# Patient Record
Sex: Female | Born: 1970 | Race: White | Hispanic: No | Marital: Married | Smoking: Former smoker
Health system: Southern US, Community
[De-identification: ages and names within clinical notes are randomized; demographics above are authoritative.]

## PROBLEM LIST (undated history)

## (undated) DIAGNOSIS — Z803 Family history of malignant neoplasm of breast: Secondary | ICD-10-CM

## (undated) DIAGNOSIS — I10 Essential (primary) hypertension: Secondary | ICD-10-CM

## (undated) DIAGNOSIS — T7840XA Allergy, unspecified, initial encounter: Secondary | ICD-10-CM

## (undated) HISTORY — DX: Essential (primary) hypertension: I10

## (undated) HISTORY — DX: Allergy, unspecified, initial encounter: T78.40XA

## (undated) HISTORY — PX: INCISE AND DRAIN ABCESS: PRO64

## (undated) HISTORY — DX: Family history of malignant neoplasm of breast: Z80.3

---

## 2001-01-27 ENCOUNTER — Other Ambulatory Visit: Admission: RE | Admit: 2001-01-27 | Discharge: 2001-01-27 | Payer: Self-pay | Admitting: Gynecology

## 2002-04-19 ENCOUNTER — Other Ambulatory Visit: Admission: RE | Admit: 2002-04-19 | Discharge: 2002-04-19 | Payer: Self-pay | Admitting: Gynecology

## 2002-06-22 HISTORY — PX: GALLBLADDER SURGERY: SHX652

## 2002-08-08 ENCOUNTER — Encounter: Payer: Self-pay | Admitting: Family Medicine

## 2002-08-08 ENCOUNTER — Ambulatory Visit (HOSPITAL_COMMUNITY): Admission: RE | Admit: 2002-08-08 | Discharge: 2002-08-08 | Payer: Self-pay | Admitting: Family Medicine

## 2002-09-05 ENCOUNTER — Encounter (HOSPITAL_COMMUNITY): Admission: RE | Admit: 2002-09-05 | Discharge: 2002-10-05 | Payer: Self-pay | Admitting: Family Medicine

## 2002-09-05 ENCOUNTER — Encounter: Payer: Self-pay | Admitting: Family Medicine

## 2002-09-06 ENCOUNTER — Encounter: Payer: Self-pay | Admitting: Family Medicine

## 2002-09-25 ENCOUNTER — Ambulatory Visit (HOSPITAL_COMMUNITY): Admission: RE | Admit: 2002-09-25 | Discharge: 2002-09-25 | Payer: Self-pay | Admitting: General Surgery

## 2003-05-01 ENCOUNTER — Ambulatory Visit (HOSPITAL_COMMUNITY): Admission: RE | Admit: 2003-05-01 | Discharge: 2003-05-01 | Payer: Self-pay | Admitting: Gynecology

## 2003-05-01 ENCOUNTER — Other Ambulatory Visit: Admission: RE | Admit: 2003-05-01 | Discharge: 2003-05-01 | Payer: Self-pay | Admitting: Gynecology

## 2004-05-29 ENCOUNTER — Ambulatory Visit (HOSPITAL_COMMUNITY): Admission: RE | Admit: 2004-05-29 | Discharge: 2004-05-29 | Payer: Self-pay | Admitting: Family Medicine

## 2004-09-01 ENCOUNTER — Ambulatory Visit (HOSPITAL_COMMUNITY): Admission: RE | Admit: 2004-09-01 | Discharge: 2004-09-01 | Payer: Self-pay | Admitting: Family Medicine

## 2004-10-08 ENCOUNTER — Other Ambulatory Visit: Admission: RE | Admit: 2004-10-08 | Discharge: 2004-10-08 | Payer: Self-pay | Admitting: Gynecology

## 2008-06-04 ENCOUNTER — Emergency Department (HOSPITAL_COMMUNITY): Admission: EM | Admit: 2008-06-04 | Discharge: 2008-06-04 | Payer: Self-pay | Admitting: Emergency Medicine

## 2008-06-27 ENCOUNTER — Emergency Department (HOSPITAL_COMMUNITY): Admission: EM | Admit: 2008-06-27 | Discharge: 2008-06-27 | Payer: Self-pay | Admitting: Emergency Medicine

## 2009-11-27 ENCOUNTER — Ambulatory Visit (HOSPITAL_COMMUNITY): Admission: RE | Admit: 2009-11-27 | Discharge: 2009-11-27 | Payer: Self-pay | Admitting: General Surgery

## 2009-12-13 ENCOUNTER — Ambulatory Visit (HOSPITAL_COMMUNITY): Admission: RE | Admit: 2009-12-13 | Discharge: 2009-12-13 | Payer: Self-pay | Admitting: General Surgery

## 2010-01-08 ENCOUNTER — Encounter: Admission: RE | Admit: 2010-01-08 | Discharge: 2010-01-30 | Payer: Self-pay | Admitting: General Surgery

## 2010-05-22 ENCOUNTER — Encounter
Admission: RE | Admit: 2010-05-22 | Discharge: 2010-07-22 | Payer: Self-pay | Source: Home / Self Care | Attending: General Surgery | Admitting: General Surgery

## 2010-06-10 ENCOUNTER — Emergency Department (HOSPITAL_COMMUNITY)
Admission: EM | Admit: 2010-06-10 | Discharge: 2010-06-10 | Payer: Self-pay | Source: Home / Self Care | Admitting: Emergency Medicine

## 2010-06-10 ENCOUNTER — Ambulatory Visit (HOSPITAL_COMMUNITY)
Admission: RE | Admit: 2010-06-10 | Discharge: 2010-06-10 | Payer: Self-pay | Source: Home / Self Care | Attending: General Surgery | Admitting: General Surgery

## 2010-06-10 HISTORY — PX: LAPAROSCOPIC GASTRIC BANDING: SHX1100

## 2010-06-11 ENCOUNTER — Ambulatory Visit: Admit: 2010-06-11 | Payer: Self-pay | Admitting: Emergency Medicine

## 2010-06-13 ENCOUNTER — Ambulatory Visit
Admission: RE | Admit: 2010-06-13 | Discharge: 2010-06-13 | Payer: Self-pay | Source: Home / Self Care | Attending: General Surgery | Admitting: General Surgery

## 2010-06-13 ENCOUNTER — Encounter (INDEPENDENT_AMBULATORY_CARE_PROVIDER_SITE_OTHER): Payer: Self-pay | Admitting: General Surgery

## 2010-06-19 ENCOUNTER — Inpatient Hospital Stay (HOSPITAL_COMMUNITY)
Admission: EM | Admit: 2010-06-19 | Discharge: 2010-06-23 | Payer: Self-pay | Source: Home / Self Care | Attending: General Surgery | Admitting: General Surgery

## 2010-07-01 ENCOUNTER — Encounter: Admit: 2010-07-01 | Discharge: 2010-07-22 | Payer: Self-pay | Attending: General Surgery | Admitting: General Surgery

## 2010-07-17 NOTE — Discharge Summary (Addendum)
April Calderon, April NO.:  [id number]  MEDICAL RECORD NO.:  [id number]          PATIENT TYPE:  INP  LOCATION:  1530                         FACILITY:  [facility]  PHYSICIAN:  Mary Sella. Andrey Campanile, MD     DATE OF BIRTH:  10/24/1970  DATE OF ADMISSION:  06/19/2010 DATE OF DISCHARGE:  06/23/2010                              DISCHARGE SUMMARY   ADMITTING DIAGNOSES: 1. Abdominal pain. 2. Leukocytosis. 3. Hypertension. 4. Morbid obesity.  DISCHARGE DIAGNOSES: 1. Hypertension. 2. Morbid obesity. 3. Resolving leukocytosis. 4. Improved abdominal pain.  BRIEF HOSPITAL COURSE:  The patient was admitted on June 19, 2010, after she presented to the ER complaining of left shoulder and epigastric and back pain.  She had recently undergone a laparoscopic adjustable gastric band placement by me on June 18, 2010.  She was doing well until the evening of June 19, 2010.  She developed acute onset of left shoulder, epigastric and back pain which was getting worse.  She denied any nausea, vomiting or retching.  She has been having bowel movements.  She denied any fevers.  In the ER, she was mildly tachycardic and was found to have elevated white blood cell count.  She was admitted and put on antibiotics and a CT scan was ordered to evaluate the leukocytosis.  A plain film of her abdomen demonstrated that her band was in good position.  The CT of her abdomen and pelvis confirmed that her band was in good position.  There was some edema in the fat adjacent to the proximal stomach as well as some extraluminal gas surrounding the band and beneath the left hemidiaphragm.  Other than that, there was no sign of gross free air. There was no evidence of contrast extravasation.  Because of the edema and fat adjacent to the proximal stomach as well as a few blips of extraluminal gas, I was concerned for possible leak.  So, therefore she underwent Gastrografin swallow the following  morning.  Again, the band was confirmed to be in good position.  Contrast passed immediately into the distal esophagus and promptly to the gastric band in the stomach.  A small volume of contrast outlined the redundant area of mucosa along the left superior portion of the band.  However, the contrast proceeded through the stomach and promptly entered the duodenum.  There was no evidence of contrast extravasation.  She was kept n.p.o. and bowel rest for several days as well as on IV antibiotics.  Her leukocytosis quickly decreased throughout her hospitalization.  She was kept on bowel rest until Sunday.  She was started on clears on June 23, 2010.  She had tolerated clear liquid diet without any fevers or chills.  She had no nausea or vomiting.  Her vital signs were stable and her leukocytosis had normalized.  Her white blood cell count was 7 and she went home later on the day on June 23, 2010.  It was  possible that one of her gastrogastric imbrication sutures had pulled out causing some of this edema and few blips of extraluminal gas but it was still unclear.  Fortunately, there were no signs of overt leak.  DISCHARGE/PLAN:  She was discharged home.  She was going to follow up with me within 1 week.  She is instructed to take adjustable gastric banding diet.  She was instructed to call for any fever, worsening abdominal pain, any nausea or vomiting or any questions or concerns.  DISCHARGE MEDICATIONS:  She is instructed to continue her home medications of Hyzaar, Lortab as needed for discomfort and Zyrtec as needed.  She was also given a prescription of Augmentin to complete a 1- week course.     Mary Sella. Andrey Campanile, MD     EMW/MEDQ  D:  07/10/2010  T:  07/10/2010  Job:  [id number]  cc:   Dr. Phillips Odor  Electronically Signed by Gaynelle Adu M.D. on 07/17/2010 08:11:23 AM

## 2010-08-12 ENCOUNTER — Encounter: Payer: Managed Care, Other (non HMO) | Attending: General Surgery | Admitting: *Deleted

## 2010-08-12 DIAGNOSIS — Z09 Encounter for follow-up examination after completed treatment for conditions other than malignant neoplasm: Secondary | ICD-10-CM | POA: Insufficient documentation

## 2010-08-12 DIAGNOSIS — Z713 Dietary counseling and surveillance: Secondary | ICD-10-CM | POA: Insufficient documentation

## 2010-08-12 DIAGNOSIS — Z9884 Bariatric surgery status: Secondary | ICD-10-CM | POA: Insufficient documentation

## 2010-09-01 LAB — BASIC METABOLIC PANEL
BUN: 9 mg/dL (ref 6–23)
CO2: 22 mEq/L (ref 19–32)
Chloride: 113 mEq/L — ABNORMAL HIGH (ref 96–112)
Chloride: 114 mEq/L — ABNORMAL HIGH (ref 96–112)
GFR calc Af Amer: >60 mL/min (ref >60)
GFR calc non Af Amer: >60 mL/min (ref >60)
GFR calc non Af Amer: >60 mL/min (ref >60)
Glucose, Bld: 78 mg/dL (ref 70–99)
Glucose, Bld: 87 mg/dL (ref 70–99)
Potassium: 3.8 mEq/L (ref 3.5–5.1)
Potassium: 4.1 mEq/L (ref 3.5–5.1)
Potassium: 4.2 mEq/L (ref 3.5–5.1)
Sodium: 143 mEq/L (ref 135–145)
Sodium: 145 mEq/L (ref 135–145)

## 2010-09-01 LAB — CBC
HCT: 34.7 % — ABNORMAL LOW (ref 36.0–46.0)
HCT: 36.3 % (ref 36.0–46.0)
HCT: 40.9 % (ref 36.0–46.0)
HCT: 42.3 % (ref 36.0–46.0)
Hemoglobin: 11 g/dL — ABNORMAL LOW (ref 12.0–15.0)
Hemoglobin: 11.7 g/dL — ABNORMAL LOW (ref 12.0–15.0)
Hemoglobin: 13.7 g/dL (ref 12.0–15.0)
Hemoglobin: 14.8 g/dL (ref 12.0–15.0)
MCH: 27.3 pg (ref 26.0–34.0)
MCH: 27.4 pg (ref 26.0–34.0)
MCH: 27.7 pg (ref 26.0–34.0)
MCH: 28.5 pg (ref 26.0–34.0)
MCHC: 31.7 g/dL (ref 30.0–36.0)
MCHC: 31.8 g/dL (ref 30.0–36.0)
MCHC: 32.4 g/dL (ref 30.0–36.0)
MCHC: 33.4 g/dL (ref 30.0–36.0)
MCV: 85.4 fL (ref 78.0–100.0)
MCV: 85.6 fL (ref 78.0–100.0)
MCV: 85.9 fL (ref 78.0–100.0)
MCV: 86.4 fL (ref 78.0–100.0)
MCV: 86.5 fL (ref 78.0–100.0)
Platelets: 195 10*3/uL (ref 150–400)
Platelets: 254 10*3/uL (ref 150–400)
Platelets: 297 10*3/uL (ref 150–400)
RBC: 4.75 MIL/uL (ref 3.87–5.11)
RBC: 4.94 MIL/uL (ref 3.87–5.11)
RBC: 5.19 MIL/uL — ABNORMAL HIGH (ref 3.87–5.11)
RDW: 14.6 % (ref 11.5–15.5)
RDW: 15.3 % (ref 11.5–15.5)
RDW: 15.5 % (ref 11.5–15.5)
WBC: 12.7 10*3/uL — ABNORMAL HIGH (ref 4.0–10.5)
WBC: 13.1 10*3/uL — ABNORMAL HIGH (ref 4.0–10.5)
WBC: 16.4 10*3/uL — ABNORMAL HIGH (ref 4.0–10.5)

## 2010-09-01 LAB — COMPREHENSIVE METABOLIC PANEL
AST: 17 U/L (ref 0–37)
BUN: 14 mg/dL (ref 6–23)
CO2: 23 mEq/L (ref 19–32)
Calcium: 9.2 mg/dL (ref 8.4–10.5)
Chloride: 109 mEq/L (ref 96–112)
Creatinine, Ser: 0.88 mg/dL (ref 0.4–1.2)
GFR calc Af Amer: >60 mL/min (ref >60)
GFR calc non Af Amer: >60 mL/min (ref >60)
Total Bilirubin: 0.8 mg/dL (ref 0.3–1.2)

## 2010-09-01 LAB — URINALYSIS, ROUTINE W REFLEX MICROSCOPIC
Glucose, UA: NEGATIVE mg/dL
Hgb urine dipstick: NEGATIVE
Ketones, ur: >80 mg/dL — AB
Leukocytes, UA: NEGATIVE
Nitrite: NEGATIVE
Protein, ur: 30 mg/dL — AB
Specific Gravity, Urine: 1.034 — ABNORMAL HIGH (ref 1.005–1.030)
Urobilinogen, UA: 0.2 mg/dL (ref 0.0–1.0)
pH: 6 (ref 5.0–8.0)

## 2010-09-01 LAB — URINE MICROSCOPIC-ADD ON

## 2010-09-01 LAB — POCT I-STAT, CHEM 8
Chloride: 111 mEq/L (ref 96–112)
Glucose, Bld: 98 mg/dL (ref 70–99)
HCT: 45 % (ref 36.0–46.0)
Hemoglobin: 15.3 g/dL — ABNORMAL HIGH (ref 12.0–15.0)
Potassium: 3.7 mEq/L (ref 3.5–5.1)
Sodium: 143 mEq/L (ref 135–145)

## 2010-09-01 LAB — BILIRUBIN, DIRECT: Bilirubin, Direct: 0.2 mg/dL (ref 0.0–0.3)

## 2010-09-01 LAB — DIFFERENTIAL
Basophils Absolute: 0 10*3/uL (ref 0.0–0.1)
Basophils Relative: 0 % (ref 0–1)
Lymphocytes Relative: 7 % — ABNORMAL LOW (ref 12–46)
Neutro Abs: 15.1 10*3/uL — ABNORMAL HIGH (ref 1.7–7.7)
Neutrophils Relative %: 85 % — ABNORMAL HIGH (ref 43–77)

## 2010-09-01 LAB — PREGNANCY, URINE: Preg Test, Ur: NEGATIVE

## 2010-09-01 LAB — LIPASE, BLOOD: Lipase: 13 U/L (ref 11–59)

## 2010-09-02 LAB — COMPREHENSIVE METABOLIC PANEL
Alkaline Phosphatase: 70 U/L (ref 39–117)
BUN: 17 mg/dL (ref 6–23)
Glucose, Bld: 95 mg/dL (ref 70–99)
Potassium: 3.6 mEq/L (ref 3.5–5.1)
Total Bilirubin: 0.3 mg/dL (ref 0.3–1.2)
Total Protein: 8.2 g/dL (ref 6.0–8.3)

## 2010-09-02 LAB — CBC
HCT: 41.5 % (ref 36.0–46.0)
MCH: 27.8 pg (ref 26.0–34.0)
MCHC: 33 g/dL (ref 30.0–36.0)
MCV: 84.2 fL (ref 78.0–100.0)
RDW: 14.2 % (ref 11.5–15.5)
WBC: 13.6 10*3/uL — ABNORMAL HIGH (ref 4.0–10.5)

## 2010-09-02 LAB — DIFFERENTIAL
Basophils Absolute: 0 10*3/uL (ref 0.0–0.1)
Basophils Relative: 0 % (ref 0–1)
Monocytes Relative: 7 % (ref 3–12)
Neutro Abs: 9.2 10*3/uL — ABNORMAL HIGH (ref 1.7–7.7)
Neutrophils Relative %: 68 % (ref 43–77)

## 2010-10-06 LAB — CBC
RBC: 4.89 MIL/uL (ref 3.87–5.11)
WBC: 10.8 10*3/uL — ABNORMAL HIGH (ref 4.0–10.5)

## 2010-10-06 LAB — COMPREHENSIVE METABOLIC PANEL
ALT: 23 U/L (ref 0–35)
AST: 22 U/L (ref 0–37)
CO2: 24 mEq/L (ref 19–32)
Chloride: 105 mEq/L (ref 96–112)
GFR calc Af Amer: >60 mL/min (ref >60)
GFR calc non Af Amer: >60 mL/min (ref >60)
Potassium: 3.6 mEq/L (ref 3.5–5.1)
Sodium: 138 mEq/L (ref 135–145)
Total Bilirubin: 0.5 mg/dL (ref 0.3–1.2)

## 2010-10-06 LAB — DIFFERENTIAL
Basophils Absolute: 0.1 10*3/uL (ref 0.0–0.1)
Eosinophils Absolute: 0.2 10*3/uL (ref 0.0–0.7)
Eosinophils Relative: 2 % (ref 0–5)

## 2010-10-07 ENCOUNTER — Ambulatory Visit: Payer: Managed Care, Other (non HMO) | Admitting: *Deleted

## 2010-11-07 NOTE — H&P (Signed)
   [name], April Calderon                            ACCOUNT NO.:  [id number]   MEDICAL RECORD NO.:  [id number]                   PATIENT TYPE:  OUT   LOCATION:  RAD                                  FACILITY:  [facility]   PHYSICIAN:  Dalia Heading, M.D.               DATE OF BIRTH:  22-Jun-1971   DATE OF ADMISSION:  08/08/2002  DATE OF DISCHARGE:  08/08/2002                                HISTORY & PHYSICAL   CHIEF COMPLAINT:  Chronic cholecystitis.   HISTORY OF PRESENT ILLNESS:  The patient is a 40 year old white female who  has been referred for evaluation and treatment of biliary colic secondary to  chronic cholecystitis.  She has been having right upper quadrant abdominal  pain with radiation to the right flank and back, nausea, indigestion,  bloating and fatty food intolerance over the past few months.  She does have  a history of a sliding hiatal hernia.  No fever, chills or jaundice have  been noted.  Her symptoms have worsened.   PAST MEDICAL HISTORY:  Extrinsic allergies.   PAST SURGICAL HISTORY:  C-sections.   CURRENT MEDICATIONS:  1. Aciphex.  2. Clarinex.  3. Celebrex.   ALLERGIES:  No known drug allergies.   REVIEW OF SYSTEMS:  She does smoke less than a pack of cigarettes a day.  She denies any significant alcohol use.  She denies any bleeding disorders.   PHYSICAL EXAMINATION:  GENERAL:  The patient is a well-developed, well-  nourished white female in no acute distress.  She is afebrile.  VITAL SIGNS:  Stable.  HEENT:  Reveals no scleral icterus.  LUNGS:  Clear to auscultation with equal breath sounds bilaterally.  HEART:  Reveals a regular rate and rhythm without S3, S4 or murmurs.  ABDOMEN:  Soft and nondistended.  She was tender in the right upper quadrant  to palpation.  No hepatosplenomegaly, masses, hernias are identified.   Ultrasound of the gallbladder is normal.  Hepatobiliary scan reveals a 32%  injection fraction with reproducible symptoms with CCK  injection.   IMPRESSION:  Chronic cholecystitis.   PLAN:  The patient is scheduled for laparoscopic cholecystectomy on  09/25/02.  The risks and benefits of the procedure, including bleeding,  infection, hepatobiliary injury, and the possibility of an open procedure  were fully explained to the patient.  Gave informed consent.                                                 Dalia Heading, M.D.    MAJ/MEDQ  D:  09/14/2002  T:  09/14/2002  Job:  [id number]   cc:   Patrica Duel, M.D.  [address]  [place]  [place] [address]  Fax: [phone]

## 2010-11-07 NOTE — Op Note (Signed)
NAME:  April Calderon, April Calderon                            ACCOUNT NO.:  [id number]   MEDICAL RECORD NO.:  [id number]                   PATIENT TYPE:  AMB   LOCATION:  DAY                                  FACILITY:  [facility]   PHYSICIAN:  Dalia Heading, M.D.               DATE OF BIRTH:  30-Apr-1971   DATE OF PROCEDURE:  09/25/2002  DATE OF DISCHARGE:                                 OPERATIVE REPORT   PREOPERATIVE DIAGNOSIS:  Chronic cholecystitis.   POSTOPERATIVE DIAGNOSIS:  Chronic cholecystitis.   PROCEDURE:  Laparoscopic cholecystectomy   SURGEON:  Dalia Heading, M.D.   ASSISTANT:  Bernerd Limbo. Leona Carry, M.D.   ANESTHESIA:  General endotracheal   INDICATIONS:  The patient is a 40 year old white female who is referred for  evaluation and treatment of biliary colic secondary to chronic  cholecystitis.  The risks and benefits of the procedure including bleeding,  infection, hepatobiliary injury, and the possibility of an open procedure  were fully explained to the patient, who gave informed consent.   DESCRIPTION OF PROCEDURE:  The patient was placed in the supine position.  After induction of general endotracheal anesthesia, the abdomen was prepped  and draped using the usual sterile technique with Betadine.  Surgical site  confirmation was performed.   A supraumbilical incision was made down to the fascia.  A Veress needle was  introduced into the abdominal cavity and confirmation of placement was done  using the saline drop test.  The abdomen was then insufflated to 16 mmHg  pressure.  An 11-mm trocar was introduced into the abdominal cavity under  direct visualization without difficulty.  The patient was placed in reverse  Trendelenburg position and an additional 11-mm trocar was placed in the  epigastric region and 5-mm trocars were placed in the right upper quadrant  and right flank regions. The liver was inspected and noted to be within  normal limits.  The gallbladder was  retracted superiorly and laterally.   The dissection was begun around the infundibulum of the gallbladder.  The  cystic duct was first identified.  Its junction to the infundibulum fully  identified.  Endoclips were placed proximally and distally on the cystic  duct; and the cystic duct was divided.  This was likewise done on the cystic  artery.  The gallbladder was then freed away from the gallbladder fossa  using Bovie electrocautery.  The gallbladder was delivered through the  epigastric trocar site without difficulty.  The gallbladder fossa was  inspected and no abnormal bleeding or bile leakage was noted.  Surgicel was  placed in the gallbladder fossa, the subhepatic space, as well as the right  hepatic gutter were evacuated of all fluid and air prior to removal of the  trocars.   All wounds were irrigated with normal saline.  All wounds were injected with  0.5% Sensorcaine.  The supraumbilical fascia was reapproximated  using an #0  Vicryl interrupted suture. All skin incisions were closed using staples.  Betadine ointment and dry sterile dressings were applied.   All tape and needle counts correct at the end of the procedure.  The patient  was extubated in the operating room and went back to recovery room in awake  and stable condition.   COMPLICATIONS:  None.   SPECIMEN:  Gallbladder.   BLOOD LOSS:  Minimal.                                               Dalia Heading, M.D.    MAJ/MEDQ  D:  09/25/2002  T:  09/25/2002  Job:  [id number]   cc:   Patrica Duel, M.D.  [address]  [place]  [place] [address]  Fax: [phone]

## 2010-11-13 ENCOUNTER — Encounter: Payer: Managed Care, Other (non HMO) | Attending: General Surgery | Admitting: *Deleted

## 2010-11-13 DIAGNOSIS — Z713 Dietary counseling and surveillance: Secondary | ICD-10-CM | POA: Insufficient documentation

## 2010-11-13 DIAGNOSIS — Z9884 Bariatric surgery status: Secondary | ICD-10-CM | POA: Insufficient documentation

## 2010-11-13 DIAGNOSIS — Z09 Encounter for follow-up examination after completed treatment for conditions other than malignant neoplasm: Secondary | ICD-10-CM | POA: Insufficient documentation

## 2011-01-06 ENCOUNTER — Encounter (INDEPENDENT_AMBULATORY_CARE_PROVIDER_SITE_OTHER): Payer: Self-pay | Admitting: General Surgery

## 2011-01-09 ENCOUNTER — Ambulatory Visit (INDEPENDENT_AMBULATORY_CARE_PROVIDER_SITE_OTHER): Payer: Managed Care, Other (non HMO) | Admitting: General Surgery

## 2011-01-09 ENCOUNTER — Encounter (INDEPENDENT_AMBULATORY_CARE_PROVIDER_SITE_OTHER): Payer: Self-pay | Admitting: General Surgery

## 2011-01-09 VITALS — BP 142/88

## 2011-01-09 DIAGNOSIS — E669 Obesity, unspecified: Secondary | ICD-10-CM

## 2011-01-09 DIAGNOSIS — Z9884 Bariatric surgery status: Secondary | ICD-10-CM

## 2011-01-09 DIAGNOSIS — Z4651 Encounter for fitting and adjustment of gastric lap band: Secondary | ICD-10-CM

## 2011-01-09 NOTE — Progress Notes (Signed)
Subjective:     Patient ID: April Calderon, female   DOB: 09-Oct-1970, 40 y.o.   MRN: [id number]  HPI 40 year old female comes in today for long-term followup after undergoing laparoscopic adjustable gastric band placement on June 10, 2010. I last saw her on Nov 13, 2010. Since I last saw her she states that she's been doing fairly well. 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 Because of the recent stress she is not really exercise hardly any at all in the past several weeks. She denies any abdominal pain. She denies any heartburn, reflux, nighttime cough or regurgitation. She really doesn't have any restriction with respect to food. She is taking her multivitamin.  Past Medical History  Diagnosis Date  . Family history of breast cancer    Past Surgical History  Procedure Date  . Cesarean section 1992  . Gallbladder surgery 2004  . Laparoscopic gastric banding 06/10/2010    Dr Gaynelle Adu; AP standard   No Known Allergies  No current outpatient prescriptions on file.     Review of Systems  Constitutional: Negative for appetite change, fatigue and unexpected weight change.  HENT: Negative.   Eyes: Negative.   Respiratory: Negative for cough, choking and shortness of breath.   Cardiovascular: Negative for chest pain.  Gastrointestinal: Negative for abdominal pain, diarrhea, constipation and abdominal distention.  Genitourinary: Negative.   Musculoskeletal: Negative.   Skin: Negative.   All other systems reviewed and are negative.       Objective:   Physical Exam  Constitutional: She is oriented to person, place, and time. She appears well-developed and well-nourished.  HENT:  Head: Normocephalic and atraumatic.  Eyes: Conjunctivae are normal. No scleral icterus.  Neck: Normal range of motion. Neck supple.  Cardiovascular: Normal rate, regular rhythm  and normal heart sounds.   Pulmonary/Chest: Effort normal and breath sounds normal. She has no wheezes.  Abdominal: Soft. Bowel sounds are normal. No hernia.         Well healed trocar incisions  Musculoskeletal: Normal range of motion. She exhibits no edema.  Neurological: She is alert and oriented to person, place, and time.  Skin: Skin is warm and dry.  Psychiatric: She has a normal mood and affect. Her behavior is normal.   Data reviewed: I reviewed my office today for May 24 as well as the LapBand flowsheet for May 24    Assessment:     Status post laparoscopic adjustable gastric band placement.         Plan:     Because she has not had any restriction lately and her weight loss has plateaued I recommended an adjustment today. We prepped her abdominal wall with ChloraPrep, I then aspirated her port in the right upper quadrant and added 0.5 cc of saline. She was able to tolerate sips of water afterward.  I also encouraged her to increase her exercise. I reminded her that to be successful with long-term weight loss she needed to not only monitor her food intake but also exercise. She has gained about 1 pound since her last visit which was 8 weeks ago. Hopefully by giving her adjustment today as well as increasing her exercise activity she'll resume her weight loss. We'll see her in 6 weeks.  She was given Agricultural engineer. I advised her that she should drink liquids for the next 48 hours. I instructed  to call the office should she have regurgitation, reflux, abdominal pain or failure to keep liquids down.

## 2011-01-09 NOTE — Patient Instructions (Signed)
Liquid diet for 2 days.  Call if have vomiting, can't keep food down, abdominal pain, worsening reflux

## 2011-02-12 ENCOUNTER — Encounter (INDEPENDENT_AMBULATORY_CARE_PROVIDER_SITE_OTHER): Payer: Self-pay | Admitting: General Surgery

## 2011-02-12 ENCOUNTER — Ambulatory Visit (INDEPENDENT_AMBULATORY_CARE_PROVIDER_SITE_OTHER): Payer: Private Health Insurance - Indemnity | Admitting: General Surgery

## 2011-02-12 VITALS — BP 122/86 | HR 60 | Ht 63.0 in | Wt 172.2 lb

## 2011-02-12 DIAGNOSIS — E669 Obesity, unspecified: Secondary | ICD-10-CM

## 2011-02-12 DIAGNOSIS — Z4651 Encounter for fitting and adjustment of gastric lap band: Secondary | ICD-10-CM

## 2011-02-12 NOTE — Patient Instructions (Signed)
Drink liquids for next 48hours. Continue to exercise on a regular basis Call for regurgitation, vomiting, abdominal pain

## 2011-02-12 NOTE — Progress Notes (Signed)
Chief complaint: LapBand fill  History of present illness: 40 year old Caucasian female status post laparoscopic adjustable gastric band placement on June 10, 2010 comes in long-term followup. I last saw her on July 20. Since she was last seen she denies any abdominal pain, regurgitation, vomiting, reflux, or nighttime cough. She denies any diarrhea or constipation. She states that she has been exercising more frequently. She states that she also has actually been jogging some. 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  Past medical, surgical, social, and family histories were reviewed and unchanged.  Review of systems: A 10 point review of systems was performed and all systems are negative except for what is mentioned in the history of present illness.  Physical exam: BP 122/86  Pulse 60  Ht 5\' 3"  (1.6 m)  Wt 172 lb 4 oz (78.132 kg)  BMI 30.51 kg/m2  See lapband flowsheet  Well developed well-nourished obese Caucasian female in no apparent distress Pulmonary-lungs are clear to auscultation Cardiac-regular rate and rhythm Abdomen-soft, nontender, and nondistended. Well healed trocar incisions. Port in the right upper quadrant. Skin-no edema  Assessment and plan: Obesity status post laparoscopic adjustable gastric band placement Hypertension-no longer requiring medication  She has only lost one pound over the past 4 weeks. Her weight loss to date has been 53.4 pounds. I congratulated her on her weight loss. However she still doesn't have much restriction.  We did an adjustment today. Her abdominal wall was prepped with ChloraPrep. I aspirated her port and added 0.25 cc of saline. She was able to tolerate sips of water. She was instructed to take liquid diet for the next 48 hours. She was instructed to call for any abdominal pain, regurgitation, or worsening reflux. I encouraged her to continue with her frequent exercise.  I will see her in 4 weeks

## 2011-03-13 ENCOUNTER — Ambulatory Visit (INDEPENDENT_AMBULATORY_CARE_PROVIDER_SITE_OTHER): Payer: Private Health Insurance - Indemnity | Admitting: General Surgery

## 2011-03-13 ENCOUNTER — Encounter (INDEPENDENT_AMBULATORY_CARE_PROVIDER_SITE_OTHER): Payer: Self-pay | Admitting: General Surgery

## 2011-03-13 VITALS — BP 146/84 | HR 76 | Temp 97.0°F | Ht 63.0 in | Wt 172.5 lb

## 2011-03-13 DIAGNOSIS — Z9884 Bariatric surgery status: Secondary | ICD-10-CM

## 2011-03-13 DIAGNOSIS — E669 Obesity, unspecified: Secondary | ICD-10-CM

## 2011-03-13 NOTE — Patient Instructions (Signed)
Try to exercise more frequently & regularly

## 2011-03-13 NOTE — Progress Notes (Signed)
Chief complaint: LapBand fill  History of present illness: 40 year old Caucasian female status post laparoscopic adjustable gastric band placement on June 10, 2010 comes in long-term followup. I last saw her on August 23. Since she was last seen she denies any abdominal pain, regurgitation, vomiting, reflux, or nighttime cough. She denies any diarrhea or constipation. She states that she has not been exercising at all. 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  Past medical, surgical, social, and family histories were reviewed and unchanged.  Review of systems: A 10 point review of systems was performed and all systems are negative except for what is mentioned in the history of present illness.  Physical exam: BP 146/84  Pulse 76  Temp(Src) 97 F (36.1 C) (Temporal)  Ht 5\' 3"  (1.6 m)  Wt 172 lb 8 oz (78.245 kg)  BMI 30.56 kg/m2  No weight change since last visit.    See lapband flowsheet  Well developed well-nourished obese Caucasian female in no apparent distress Pulmonary-lungs are clear to auscultation Cardiac-regular rate and rhythm Abdomen-soft, nontender, and nondistended. Well healed trocar incisions. Port in the right upper quadrant. Skin-no edema  Assessment and plan: Obesity status post laparoscopic adjustable gastric band placement Hypertension-no longer requiring medication  She has not lost any weight since her last visit. We did not do an adjustment today.  I encouraged her to exercise as this is an essential component to helping her loose weight.     I will see her in 4 weeks

## 2011-03-26 LAB — URINALYSIS, ROUTINE W REFLEX MICROSCOPIC
Glucose, UA: NEGATIVE mg/dL
Ketones, ur: NEGATIVE mg/dL
Protein, ur: 30 mg/dL — AB

## 2011-03-26 LAB — PREGNANCY, URINE: Preg Test, Ur: NEGATIVE

## 2011-03-26 LAB — URINE MICROSCOPIC-ADD ON

## 2011-04-15 ENCOUNTER — Encounter (INDEPENDENT_AMBULATORY_CARE_PROVIDER_SITE_OTHER): Payer: Self-pay | Admitting: General Surgery

## 2011-04-15 ENCOUNTER — Ambulatory Visit (INDEPENDENT_AMBULATORY_CARE_PROVIDER_SITE_OTHER): Payer: Private Health Insurance - Indemnity | Admitting: General Surgery

## 2011-04-15 VITALS — BP 150/90 | HR 92 | Temp 98.7°F | Resp 16 | Ht 63.0 in | Wt 170.0 lb

## 2011-04-15 DIAGNOSIS — Z4651 Encounter for fitting and adjustment of gastric lap band: Secondary | ICD-10-CM

## 2011-04-15 NOTE — Progress Notes (Signed)
Chief complaint: LapBand fill  History of present illness: 40 year old Caucasian female status post laparoscopic adjustable gastric band placement on June 10, 2010 comes in long-term followup. I last saw her on Sept 21. Since she was last seen she denies any abdominal pain, regurgitation, vomiting, reflux, or nighttime cough. She had one episode of reflux after taking some cold medication. She has some post nasal drainage. No fever or chills or cough. She denies any diarrhea or constipation. She states that she has been walking the dogs 3 days/week. 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  Past medical, surgical, social, and family histories were reviewed and unchanged.  Review of systems: A 10 point review of systems was performed and all systems are negative except for what is mentioned in the history of present illness.  Physical exam: BP 150/90  Pulse 92  Temp(Src) 98.7 F (37.1 C) (Temporal)  Resp 16  Ht 5\' 3"  (1.6 m)  Wt 170 lb (77.111 kg)  BMI 30.11 kg/m2  2 pound weight loss since last visit.    See lapband flowsheet  Well developed well-nourished obese Caucasian female in no apparent distress Pulmonary-lungs are clear to auscultation Cardiac-regular rate and rhythm Abdomen-soft, nontender, and nondistended. Well healed trocar incisions. Port in the right upper quadrant. Skin-no edema  Assessment and plan: Obesity status post laparoscopic adjustable gastric band placement Hypertension-no longer requiring medication  She has lost 2 pounds since her last visit. Total weight loss around 56 pounds. We did an adjustment today.  I encouraged her to exercise as this is an essential component to helping her loose weight.    We prepped her abdominal wall a ChloraPrep. Then using a Huber gauge needle I aspirated her port in the right upper quadrant. I added 0.5 cc of saline to her port. She was able to tolerate sips of water. She was instructed to do a  liquid diet for the next 2 days.  I will see her in 4 weeks

## 2011-04-15 NOTE — Patient Instructions (Signed)

## 2011-06-05 ENCOUNTER — Encounter (INDEPENDENT_AMBULATORY_CARE_PROVIDER_SITE_OTHER): Payer: Private Health Insurance - Indemnity | Admitting: General Surgery

## 2011-07-15 ENCOUNTER — Encounter (INDEPENDENT_AMBULATORY_CARE_PROVIDER_SITE_OTHER): Payer: Private Health Insurance - Indemnity | Admitting: General Surgery

## 2011-11-12 ENCOUNTER — Encounter (INDEPENDENT_AMBULATORY_CARE_PROVIDER_SITE_OTHER): Payer: Private Health Insurance - Indemnity | Admitting: General Surgery

## 2011-12-18 ENCOUNTER — Ambulatory Visit (INDEPENDENT_AMBULATORY_CARE_PROVIDER_SITE_OTHER): Payer: Private Health Insurance - Indemnity | Admitting: General Surgery

## 2011-12-18 ENCOUNTER — Encounter (INDEPENDENT_AMBULATORY_CARE_PROVIDER_SITE_OTHER): Payer: Self-pay | Admitting: General Surgery

## 2011-12-18 VITALS — BP 108/60 | HR 60 | Resp 14 | Ht 63.0 in | Wt 172.0 lb

## 2011-12-18 DIAGNOSIS — Z9884 Bariatric surgery status: Secondary | ICD-10-CM

## 2011-12-18 NOTE — Patient Instructions (Signed)
1. Stay on liquids for the next 1 day as you adapt to your new fill volume.  Then resume your previous diet. 2. Decreasing your carbohydrate intake will hasten you weight loss.  Rely more on proteins for your meals.  Avoid condiments that contain sweets such as Honey Mustard and sugary salad dressings.   3. Stay in the "green zone".  If you are regurgitating with meals, having night time reflux, and find yourself eating soft comfort foods (mashed potatoes, potato chips)...realize that you are developing "maladaptive eating".  You will not lose weight this way and may regain weight.  The GREEN ZONE is eating smaller portions and not regurgitating.  Hence we may need to withdraw fluid from your band. 4. Build exercise into your daily routine.  Walking is the best way to start but do something every day if you can.   5. Consider getting a pedometer to track your daily steps and setting goals

## 2011-12-21 ENCOUNTER — Encounter (INDEPENDENT_AMBULATORY_CARE_PROVIDER_SITE_OTHER): Payer: Self-pay | Admitting: General Surgery

## 2011-12-21 NOTE — Progress Notes (Signed)
Subjective:     Patient ID: April Calderon, female   DOB: 1970-11-06, 41 y.o.   MRN: [id number]  HPI A 41 year old Caucasian female comes in for long-term followup after undergoing laparoscopic adjustable gastric band placement in December 2011. She was last seen in the office in October 2012. She states that work has been very busy and it is difficult to come in. 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 She has not been getting much daily exercise. She states that on occasion she is still struggling to make the appropriate food choices. She reports that she is taking her supplements. She denies any reflux or regurgitation. She denies any heartburn or morning cough. She reports daily bowel movements. She denies any abdominal pain.  PMHx, PSHx, SOCHx, FAMHx, ALL reviewed and unchanged  Review of Systems     Objective:   Physical Exam BP 108/60  Pulse 60  Resp 14  Ht 5\' 3"  (1.6 m)  Wt 172 lb (78.019 kg)  BMI 30.47 kg/m2  See LapBand flowsheet  Gen: alert, NAD, non-toxic appearing HEENT: normocephalic, atraumatic; pupils equal, no scleral icterus, neck supple, no lymphadenopathy Pulm: Lungs clear to auscultation, symmetric chest rise CV: regular rate and rhythm Abd: soft, nontender, nondistended. Well-healed trocar sites. No incisional hernia. Port is in right mid-abdomen Ext: no edema, normal, symmetric strength Neuro: nonfocal, sensation grossly intact Psych: appropriate, judgment normal     Assessment:     41 year old Caucasian female status post laparoscopic adjustable gastric band placement in December 2011    Plan:     Her weight loss to date is around 51.4 pounds. She has gained 2 pounds since her last visit in October 2012. We discussed the importance of some form of daily exercise. We talked about different exercise strategies such as wearing a pedometer and keeping track of her daily steps. We did discuss waiting 1-to 1-1/2 minutes been taking bites of food.   After  obtaining verbal consent, the abdominal wall was prepped with Chloraprep. The port was accessed with a Huber needle and 0.5 cc of saline was added to give the patient an expected fill volume of 6.8 cc.  The patient was able to tolerate sips of water.  Liquids for the next 24 hours. Followup 4-6 weeks.  Mary Sella. Andrey Campanile, MD, FACS General, Bariatric, & Minimally Invasive Surgery [facility], [place]

## 2012-01-13 ENCOUNTER — Ambulatory Visit (HOSPITAL_COMMUNITY)
Admission: RE | Admit: 2012-01-13 | Discharge: 2012-01-13 | Disposition: A | Discharge status: Home / Self Care | Payer: Private Health Insurance - Indemnity | Source: Ambulatory Visit | Attending: Family Medicine | Admitting: Family Medicine

## 2012-01-13 ENCOUNTER — Other Ambulatory Visit (HOSPITAL_COMMUNITY): Payer: Self-pay | Admitting: Family Medicine

## 2012-01-13 DIAGNOSIS — M25519 Pain in unspecified shoulder: Secondary | ICD-10-CM

## 2012-01-28 ENCOUNTER — Encounter (INDEPENDENT_AMBULATORY_CARE_PROVIDER_SITE_OTHER): Payer: Self-pay

## 2012-01-28 ENCOUNTER — Ambulatory Visit (INDEPENDENT_AMBULATORY_CARE_PROVIDER_SITE_OTHER): Payer: Private Health Insurance - Indemnity | Admitting: Physician Assistant

## 2012-01-28 VITALS — BP 136/78 | HR 72 | Temp 97.7°F | Resp 16 | Ht 63.0 in | Wt 176.4 lb

## 2012-01-28 DIAGNOSIS — Z4651 Encounter for fitting and adjustment of gastric lap band: Secondary | ICD-10-CM

## 2012-01-28 NOTE — Progress Notes (Signed)
  HISTORY: JAMESETTA GREENHALGH is a 41 y.o.female who received an AP-Standard lap-band in December 2011 by Dr. Andrey Campanile. She comes in with complaints of hunger more recently with larger portion sizes. She has rare episodes of feeling full with just a bite or two. No regurgitation.  VITAL SIGNS: Filed Vitals:   01/28/12 1100  BP: 136/78  Pulse: 72  Temp: 97.7 F (36.5 C)  Resp: 16    PHYSICAL EXAM: Physical exam reveals a very well-appearing 41 y.o.female in no apparent distress Neurologic: Awake, alert, oriented Psych: Bright affect, conversant Respiratory: Breathing even and unlabored. No stridor or wheezing Abdomen: Soft, nontender, nondistended to palpation. Incisions well-healed. No incisional hernias. Port easily palpated. Extremities: Atraumatic, good range of motion.  ASSESMENT: 41 y.o.  female  s/p AP-Standard lap-band.   PLAN: The patient's port was accessed with a 20G Huber needle without difficulty. Clear fluid was aspirated and 0.2 mL saline was added to the port to give a total predicted volume of 7 mL. The patient was able to swallow water without difficulty following the procedure and was instructed to take clear liquids for the next 24-48 hours and advance slowly as tolerated.

## 2012-01-28 NOTE — Patient Instructions (Signed)
Take clear liquids tonight. Thin protein shakes are ok to start tomorrow morning. Slowly advance your diet thereafter. Call us if you have persistent vomiting or regurgitation, night cough or reflux symptoms. Return as scheduled or sooner if you notice no changes in hunger/portion sizes.  

## 2012-04-28 ENCOUNTER — Encounter (INDEPENDENT_AMBULATORY_CARE_PROVIDER_SITE_OTHER): Payer: Self-pay

## 2012-04-28 ENCOUNTER — Ambulatory Visit (INDEPENDENT_AMBULATORY_CARE_PROVIDER_SITE_OTHER): Payer: Private Health Insurance - Indemnity | Admitting: Physician Assistant

## 2012-04-28 DIAGNOSIS — Z9884 Bariatric surgery status: Secondary | ICD-10-CM

## 2012-04-28 NOTE — Patient Instructions (Signed)
Return in 3 months. Focus on good food choices as well as physical activity. Return sooner if you have an increase in hunger, portion sizes or weight. Return also for difficulty swallowing, night cough, reflux. 

## 2012-04-28 NOTE — Progress Notes (Signed)
  HISTORY: MARITSSA HAUGHTON is a 41 y.o.female who received an AP-Standard lap-band in December 2011 by Dr. Andrey Campanile. She comes in with no complaints of increasing hunger or larger portion sizes. She attributes her stagnation in weight loss to her second shift job and difficulty maintaining an appropriate diet as a result. She denies regurgitation or night cough. She eats well when not working.  VITAL SIGNS: Filed Vitals:   04/28/12 1055  BP: 138/82  Pulse: 100    PHYSICAL EXAM: Physical exam reveals a very well-appearing 41 y.o.female in no apparent distress Neurologic: Awake, alert, oriented Psych: Bright affect, conversant Respiratory: Breathing even and unlabored. No stridor or wheezing Extremities: Atraumatic, good range of motion. Skin: Warm, Dry, no rashes Musculoskeletal: Normal gait, Joints normal  ASSESMENT: 41 y.o.  female  s/p AP-Standard lap-band.   PLAN: As she appears to be in the green zone, we opted to defer an adjustment today. It sounds like learning some nutritional 'coping mechanisms' at work would be helpful. We'll have her back in three months or sooner if needed.

## 2015-09-30 ENCOUNTER — Encounter: Payer: Self-pay | Admitting: Obstetrics and Gynecology

## 2015-09-30 ENCOUNTER — Other Ambulatory Visit: Payer: Self-pay | Admitting: Obstetrics and Gynecology

## 2015-09-30 ENCOUNTER — Other Ambulatory Visit (HOSPITAL_COMMUNITY)
Admission: RE | Admit: 2015-09-30 | Discharge: 2015-09-30 | Disposition: A | Discharge status: Home / Self Care | Payer: BLUE CROSS/BLUE SHIELD | Source: Ambulatory Visit | Attending: Obstetrics and Gynecology | Admitting: Obstetrics and Gynecology

## 2015-09-30 ENCOUNTER — Ambulatory Visit (INDEPENDENT_AMBULATORY_CARE_PROVIDER_SITE_OTHER): Payer: BLUE CROSS/BLUE SHIELD | Admitting: Obstetrics and Gynecology

## 2015-09-30 VITALS — BP 156/90 | Ht 63.0 in | Wt 182.0 lb

## 2015-09-30 DIAGNOSIS — D251 Intramural leiomyoma of uterus: Secondary | ICD-10-CM | POA: Diagnosis not present

## 2015-09-30 DIAGNOSIS — Z01419 Encounter for gynecological examination (general) (routine) without abnormal findings: Secondary | ICD-10-CM | POA: Insufficient documentation

## 2015-09-30 DIAGNOSIS — Z1151 Encounter for screening for human papillomavirus (HPV): Secondary | ICD-10-CM | POA: Insufficient documentation

## 2015-09-30 DIAGNOSIS — Z01411 Encounter for gynecological examination (general) (routine) with abnormal findings: Secondary | ICD-10-CM

## 2015-09-30 DIAGNOSIS — N920 Excessive and frequent menstruation with regular cycle: Secondary | ICD-10-CM | POA: Diagnosis not present

## 2015-09-30 DIAGNOSIS — Z1231 Encounter for screening mammogram for malignant neoplasm of breast: Secondary | ICD-10-CM

## 2015-09-30 NOTE — Progress Notes (Signed)
Patient ID: April Calderon, female   DOB: Aug 08, 1970, 45 y.o.   MRN: [id number] Assessment:  Annual Gyn Exam 1. Uterine enlargement 8 weeks, suspected fibroids 2. Stable heavy periods Plan:  1. pap smear done, next pap due in 3 years  2. return annually or prn 3    Annual mammogram advised 4.   Recommended labs: CBC, CMeT, thyroid function 5.   Discuss endometrial ablation and IUD placement.  Subjective:  April Calderon is a 45 y.o. female No obstetric history on file. who presents for annual exam. Patient's last menstrual period was 09/09/2015. The patient has complaints today of heavier menstrual periods with age. Pt reports her periods last approximately 6-7 days. Pt denies any associated menstrual cramping. Pt denies increased fatigue during her menstrual cycle.   The following portions of the patient's history were reviewed and updated as appropriate: allergies, current medications, past family history, past medical history, past social history, past surgical history and problem list. Past Medical History  Diagnosis Date  . Family history of breast cancer   . Allergy   . Hypertension     Past Surgical History  Procedure Laterality Date  . Cesarean section  1992  . Gallbladder surgery  2004  . Laparoscopic gastric banding  06/10/2010    Dr Greer Pickerel; AP standard  . Incise and drain abcess      Current outpatient prescriptions:  .  cetirizine (ZYRTEC) 10 MG tablet, Take 10 mg by mouth 2 (two) times daily.  , Disp: , Rfl:   Review of Systems Constitutional: negative except for intermittent heavy bleeding during menstrual cycle  Gastrointestinal: negative Genitourinary: negative   Objective:  BP 156/90 mmHg  Ht 5\' 3"  (1.6 m)  Wt 182 lb (82.555 kg)  BMI 32.25 kg/m2  LMP 09/09/2015   BMI: Body mass index is 32.25 kg/(m^2).  General Appearance: Alert, appropriate appearance for age. No acute distress HEENT: Grossly normal Neck / Thyroid:  Cardiovascular: RRR; normal S1,  S2, no murmur Lungs: CTA bilaterally Back: No CVAT Breast Exam: No dimpling, nipple retraction or discharge. No masses or nodes., Normal to inspection, Normal breast tissue bilaterally and No masses or nodes.No dimpling, nipple retraction or discharge. Gastrointestinal: Soft, non-tender, no masses or organomegaly Pelvic Exam: Vulva and vagina appear normal. Bimanual exam reveals normal uterus and adnexa. External genitalia: normal general appearance Urinary system: urethral meatus normal Vaginal: normal mucosa without prolapse or lesions, normal without tenderness, induration or masses and normal rugae Cervix: normal appearance and well healed old c-section scar Adnexa: normal bimanual exam Uterus: slightly enlarged, approximately 8 week size, but well supported Rectal: good sphincter tone and guaiac negative Rectovaginal: normal rectal, no masses Lymphatic Exam: Non-palpable nodes in neck, clavicular, axillary, or inguinal regions Skin: no rash or abnormalities Neurologic: Normal gait and speech, no tremor  Psychiatric: Alert and oriented, appropriate affect.  Urinalysis:Not done  Mallory Shirk. MD Pgr [phone] 3:26 PM    By signing my name below, I, Terressa Koyanagi, attest that this documentation has been prepared under the direction and in the presence of Mallory Shirk, MD. Electronically Signed: Terressa Koyanagi, ED Scribe. 09/30/2015. 3:26 PM.   I personally performed the services described in this documentation, which was SCRIBED in my presence. The recorded information has been reviewed and considered accurate. It has been edited as necessary during review. Jonnie Kind, MD

## 2015-10-02 ENCOUNTER — Ambulatory Visit (HOSPITAL_COMMUNITY)
Admission: RE | Admit: 2015-10-02 | Discharge: 2015-10-02 | Disposition: A | Discharge status: Home / Self Care | Payer: BLUE CROSS/BLUE SHIELD | Source: Ambulatory Visit | Attending: Obstetrics and Gynecology | Admitting: Obstetrics and Gynecology

## 2015-10-02 DIAGNOSIS — Z1231 Encounter for screening mammogram for malignant neoplasm of breast: Secondary | ICD-10-CM | POA: Diagnosis present

## 2015-10-04 LAB — CYTOLOGY - PAP

## 2016-07-29 IMAGING — MG MM DIGITAL SCREENING BILAT W/ CAD
4 series · 4 of 4 positions shown · non-contrast
Comparison: None.

CLINICAL DATA: Screening.

EXAM:
DIGITAL SCREENING BILATERAL MAMMOGRAM WITH CAD

[R CC]
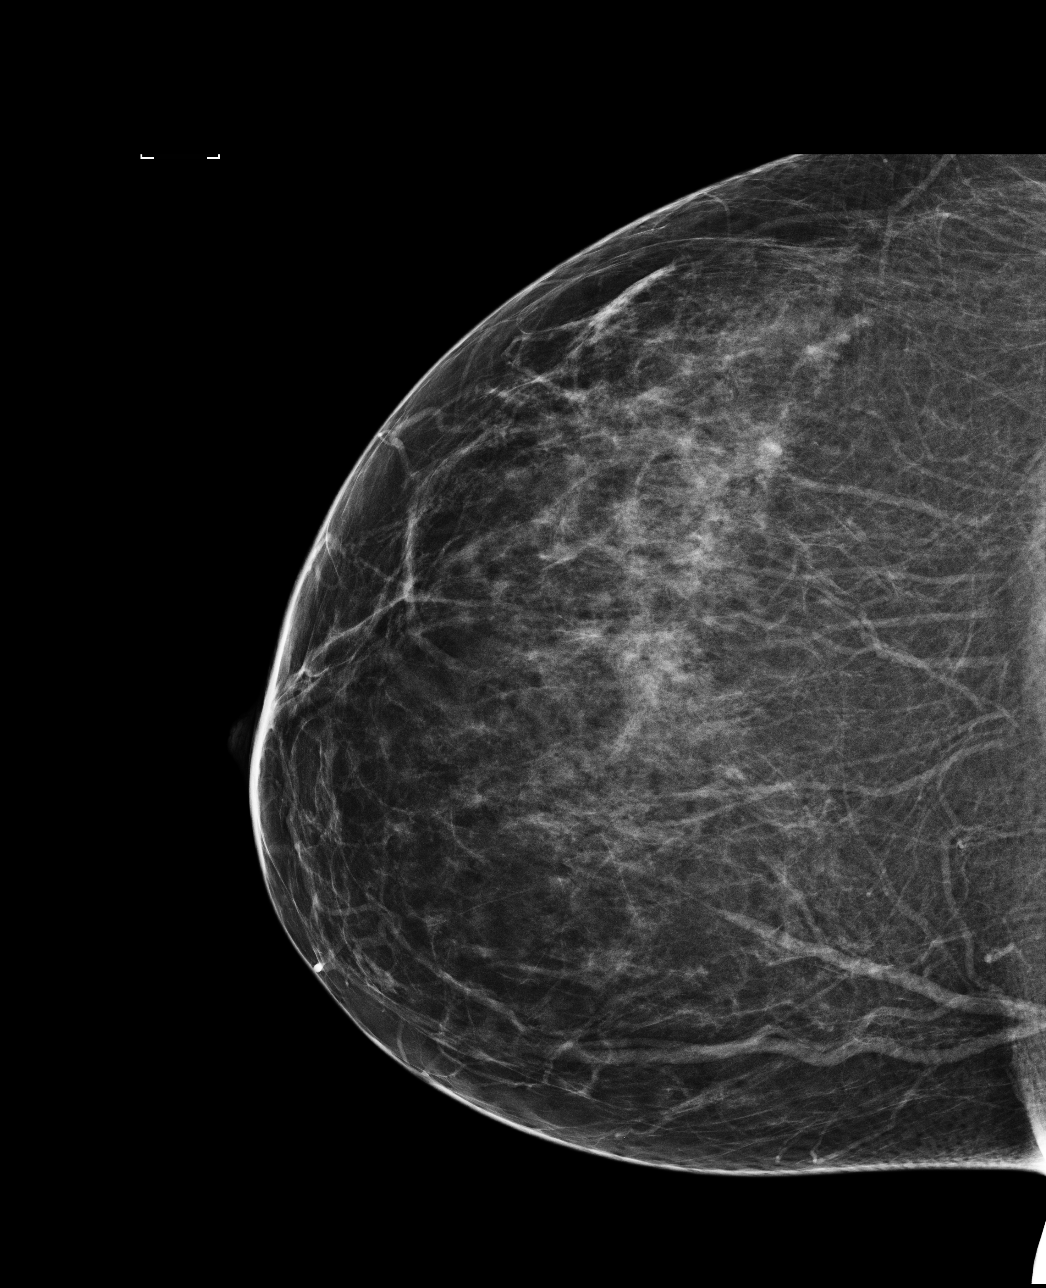

[R MLO]
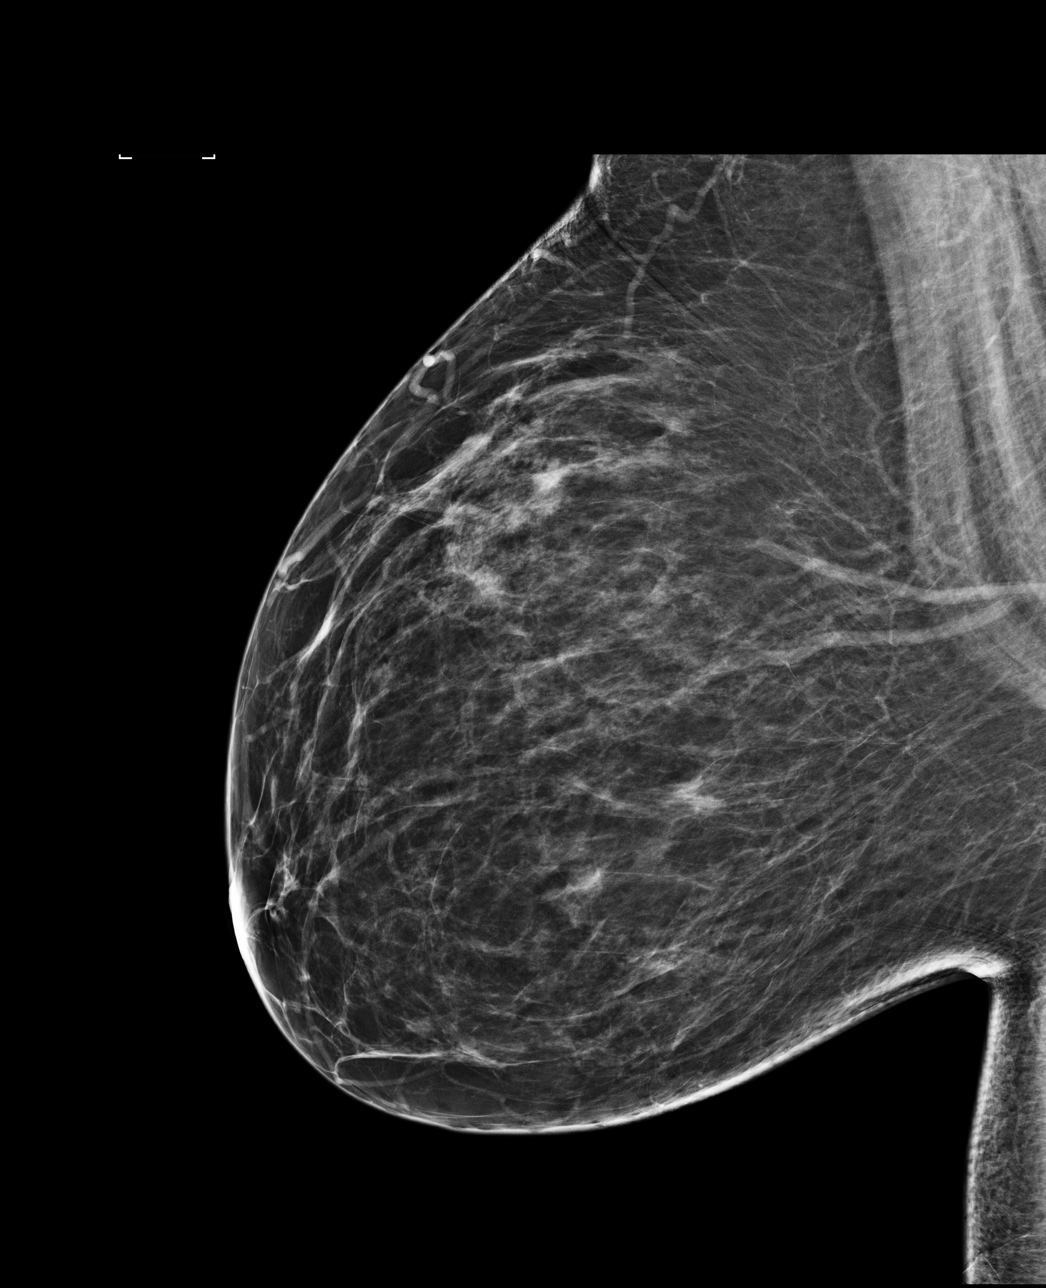

[L CC]
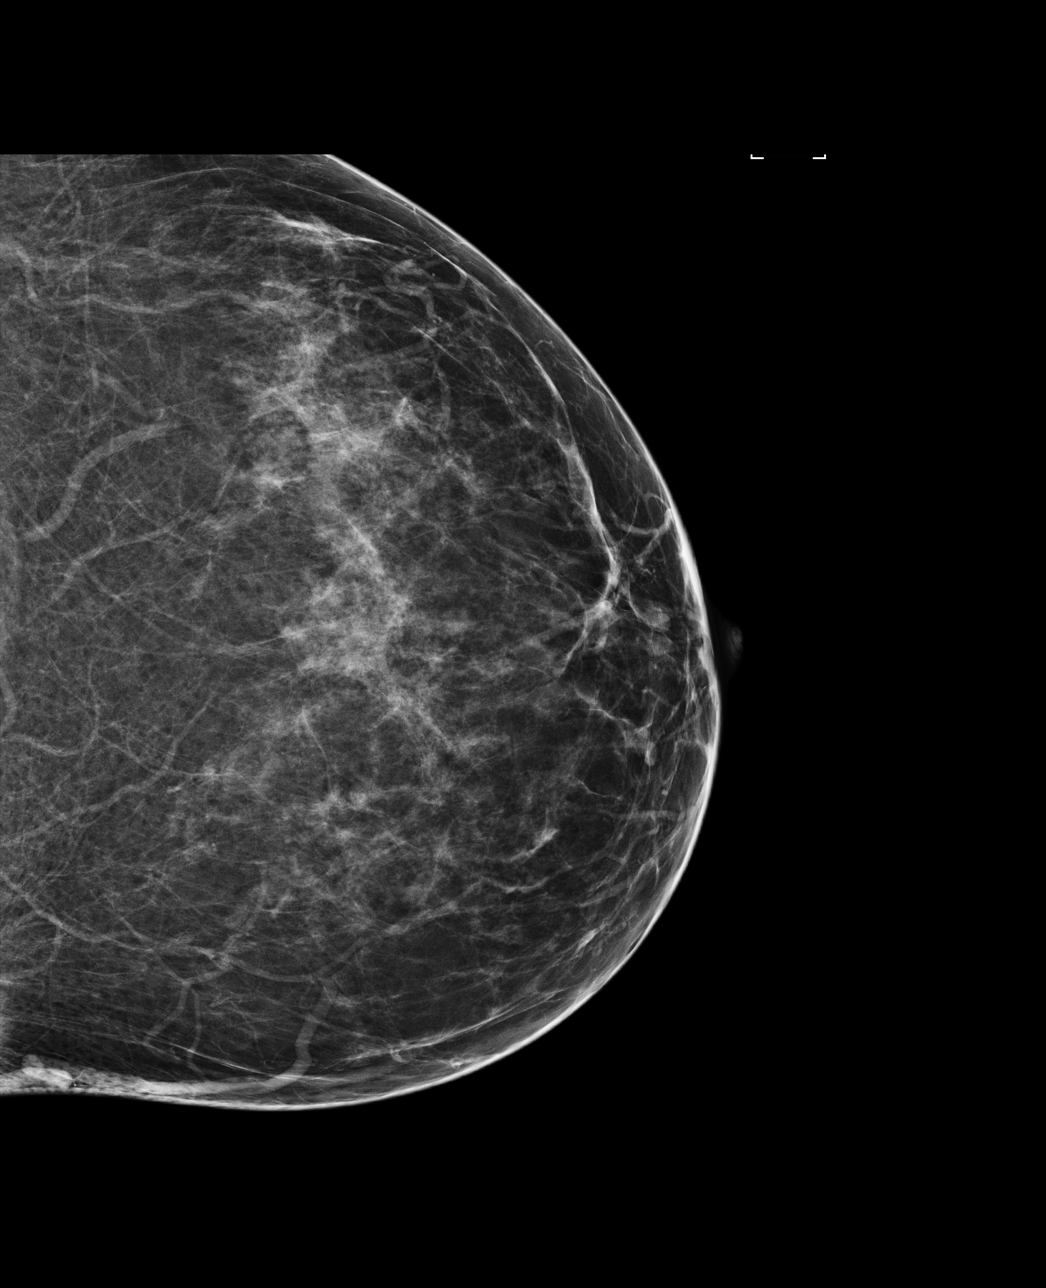

[L MLO]
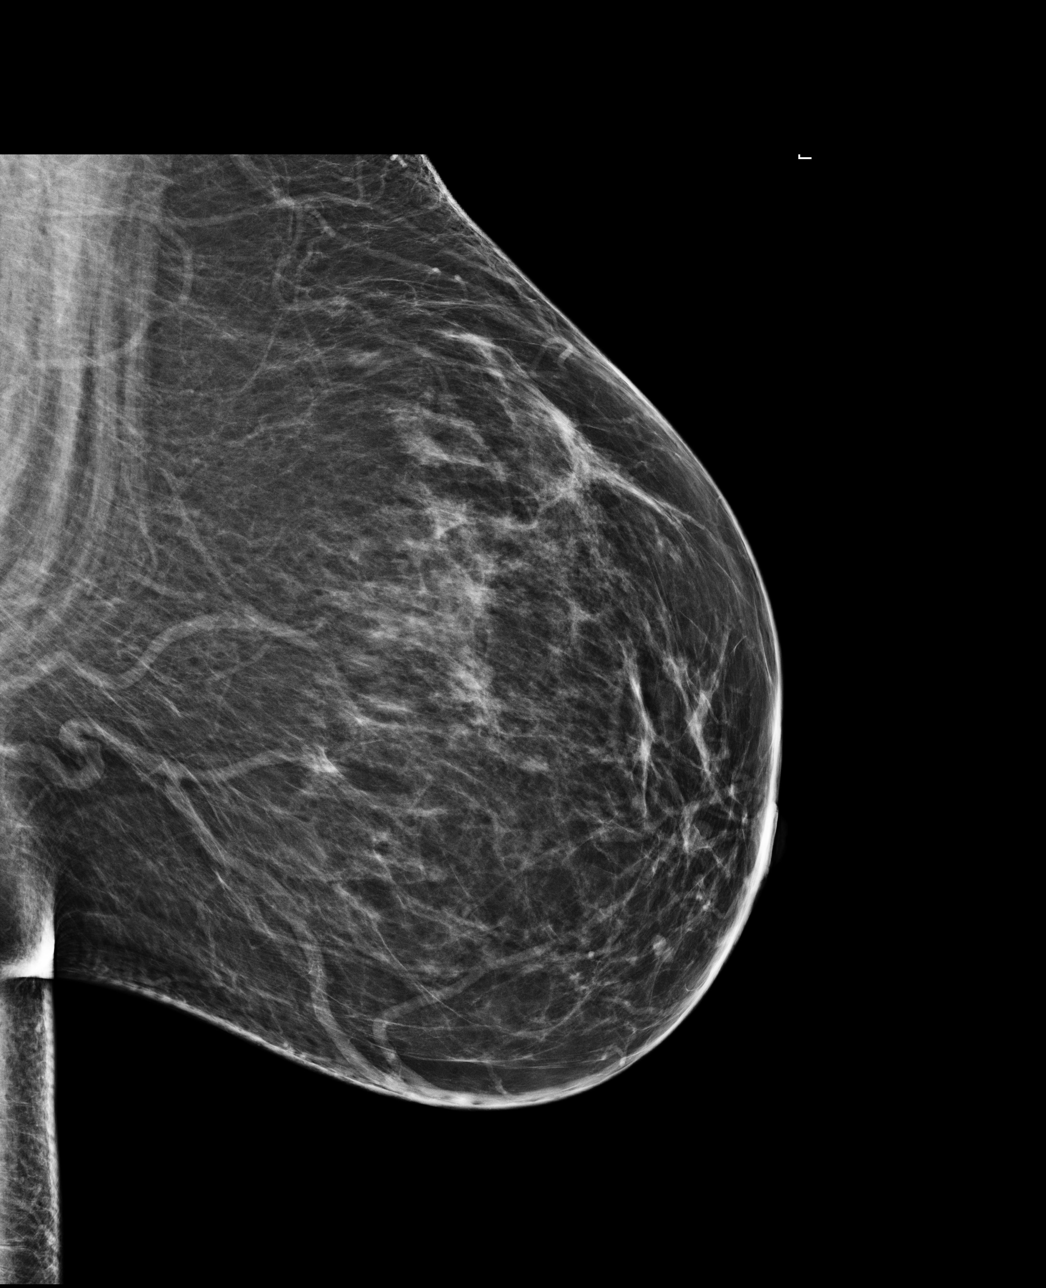

[4 of 4 positions shown; findings below may reference images not displayed]

ACR Breast Density Category b: There are scattered areas of
fibroglandular density.
FINDINGS: There are no findings suspicious for malignancy. Images were
processed with CAD.
IMPRESSION: No mammographic evidence of malignancy. A result letter of this
screening mammogram will be mailed directly to the patient.

RECOMMENDATION:
Screening mammogram in one year. (Code:[id number])

BI-RADS CATEGORY  1: Negative.

## 2017-01-14 ENCOUNTER — Encounter (HOSPITAL_COMMUNITY): Payer: Self-pay

## 2017-04-22 ENCOUNTER — Telehealth (HOSPITAL_COMMUNITY): Payer: Self-pay

## 2017-04-22 NOTE — Telephone Encounter (Signed)
This patient is overdue for recommended follow-up with a bariatric surgeon at [facility]. A letter was originally mailed to the address on file from both [facility] in attempt to reestablish post-op care. Letter has been returned to [facility] marked undeliverable, forward time expired to send to new address. Sent letter out again today to new address provided from the post office: [address] [place], [place] [address]

## 2017-11-23 DIAGNOSIS — J029 Acute pharyngitis, unspecified: Secondary | ICD-10-CM | POA: Diagnosis not present

## 2017-11-23 DIAGNOSIS — R52 Pain, unspecified: Secondary | ICD-10-CM | POA: Diagnosis not present

## 2017-11-23 DIAGNOSIS — R509 Fever, unspecified: Secondary | ICD-10-CM | POA: Diagnosis not present

## 2017-11-23 DIAGNOSIS — J020 Streptococcal pharyngitis: Secondary | ICD-10-CM | POA: Diagnosis not present

## 2018-05-10 DIAGNOSIS — Z6832 Body mass index (BMI) 32.0-32.9, adult: Secondary | ICD-10-CM | POA: Diagnosis not present

## 2018-05-10 DIAGNOSIS — R05 Cough: Secondary | ICD-10-CM | POA: Diagnosis not present

## 2018-05-10 DIAGNOSIS — J209 Acute bronchitis, unspecified: Secondary | ICD-10-CM | POA: Diagnosis not present

## 2018-07-29 DIAGNOSIS — Z6832 Body mass index (BMI) 32.0-32.9, adult: Secondary | ICD-10-CM | POA: Diagnosis not present

## 2018-07-29 DIAGNOSIS — J209 Acute bronchitis, unspecified: Secondary | ICD-10-CM | POA: Diagnosis not present

## 2018-07-29 DIAGNOSIS — R05 Cough: Secondary | ICD-10-CM | POA: Diagnosis not present

## 2018-12-06 DIAGNOSIS — G4700 Insomnia, unspecified: Secondary | ICD-10-CM | POA: Diagnosis not present

## 2018-12-06 DIAGNOSIS — Z6833 Body mass index (BMI) 33.0-33.9, adult: Secondary | ICD-10-CM | POA: Diagnosis not present

## 2018-12-06 DIAGNOSIS — E6609 Other obesity due to excess calories: Secondary | ICD-10-CM | POA: Diagnosis not present

## 2018-12-06 DIAGNOSIS — Z1389 Encounter for screening for other disorder: Secondary | ICD-10-CM | POA: Diagnosis not present

## 2018-12-06 DIAGNOSIS — Z0000 Encounter for general adult medical examination without abnormal findings: Secondary | ICD-10-CM | POA: Diagnosis not present

## 2018-12-13 DIAGNOSIS — Z0000 Encounter for general adult medical examination without abnormal findings: Secondary | ICD-10-CM | POA: Diagnosis not present

## 2018-12-13 DIAGNOSIS — I10 Essential (primary) hypertension: Secondary | ICD-10-CM | POA: Diagnosis not present

## 2019-02-24 DIAGNOSIS — J302 Other seasonal allergic rhinitis: Secondary | ICD-10-CM | POA: Diagnosis not present

## 2019-02-24 DIAGNOSIS — E6609 Other obesity due to excess calories: Secondary | ICD-10-CM | POA: Diagnosis not present

## 2019-02-24 DIAGNOSIS — G4700 Insomnia, unspecified: Secondary | ICD-10-CM | POA: Diagnosis not present

## 2019-02-24 DIAGNOSIS — Z6833 Body mass index (BMI) 33.0-33.9, adult: Secondary | ICD-10-CM | POA: Diagnosis not present

## 2019-09-11 DIAGNOSIS — Z681 Body mass index (BMI) 19 or less, adult: Secondary | ICD-10-CM | POA: Diagnosis not present

## 2019-09-11 DIAGNOSIS — J309 Allergic rhinitis, unspecified: Secondary | ICD-10-CM | POA: Diagnosis not present

## 2019-09-11 DIAGNOSIS — J019 Acute sinusitis, unspecified: Secondary | ICD-10-CM | POA: Diagnosis not present

## 2019-10-02 ENCOUNTER — Encounter (INDEPENDENT_AMBULATORY_CARE_PROVIDER_SITE_OTHER): Payer: Self-pay | Admitting: *Deleted

## 2019-11-24 ENCOUNTER — Telehealth: Payer: Self-pay | Admitting: Obstetrics & Gynecology

## 2019-11-24 NOTE — Telephone Encounter (Signed)

## 2019-11-27 ENCOUNTER — Other Ambulatory Visit: Payer: BLUE CROSS/BLUE SHIELD | Admitting: Obstetrics & Gynecology

## 2019-11-29 DIAGNOSIS — Z1389 Encounter for screening for other disorder: Secondary | ICD-10-CM | POA: Diagnosis not present

## 2019-11-29 DIAGNOSIS — E6609 Other obesity due to excess calories: Secondary | ICD-10-CM | POA: Diagnosis not present

## 2019-11-29 DIAGNOSIS — J019 Acute sinusitis, unspecified: Secondary | ICD-10-CM | POA: Diagnosis not present

## 2019-11-29 DIAGNOSIS — H6993 Unspecified Eustachian tube disorder, bilateral: Secondary | ICD-10-CM | POA: Diagnosis not present

## 2019-11-29 DIAGNOSIS — Z6833 Body mass index (BMI) 33.0-33.9, adult: Secondary | ICD-10-CM | POA: Diagnosis not present

## 2019-12-05 ENCOUNTER — Encounter: Payer: Self-pay | Admitting: Nurse Practitioner

## 2019-12-14 ENCOUNTER — Encounter (INDEPENDENT_AMBULATORY_CARE_PROVIDER_SITE_OTHER): Payer: Self-pay | Admitting: *Deleted

## 2020-02-15 ENCOUNTER — Ambulatory Visit: Payer: Private Health Insurance - Indemnity | Admitting: Nurse Practitioner

## 2020-02-28 DIAGNOSIS — Z1231 Encounter for screening mammogram for malignant neoplasm of breast: Secondary | ICD-10-CM | POA: Diagnosis not present

## 2020-03-06 DIAGNOSIS — R928 Other abnormal and inconclusive findings on diagnostic imaging of breast: Secondary | ICD-10-CM | POA: Diagnosis not present

## 2020-03-06 DIAGNOSIS — N6322 Unspecified lump in the left breast, upper inner quadrant: Secondary | ICD-10-CM | POA: Diagnosis not present

## 2020-03-08 DIAGNOSIS — Z20822 Contact with and (suspected) exposure to covid-19: Secondary | ICD-10-CM | POA: Diagnosis not present

## 2020-03-08 DIAGNOSIS — Z01812 Encounter for preprocedural laboratory examination: Secondary | ICD-10-CM | POA: Diagnosis not present

## 2020-03-11 DIAGNOSIS — K635 Polyp of colon: Secondary | ICD-10-CM | POA: Diagnosis not present

## 2020-03-11 DIAGNOSIS — Z1211 Encounter for screening for malignant neoplasm of colon: Secondary | ICD-10-CM | POA: Diagnosis not present

## 2020-03-11 DIAGNOSIS — D126 Benign neoplasm of colon, unspecified: Secondary | ICD-10-CM | POA: Diagnosis not present

## 2020-03-11 DIAGNOSIS — F17210 Nicotine dependence, cigarettes, uncomplicated: Secondary | ICD-10-CM | POA: Diagnosis not present

## 2020-03-11 DIAGNOSIS — Z9884 Bariatric surgery status: Secondary | ICD-10-CM | POA: Diagnosis not present

## 2020-03-11 DIAGNOSIS — K5730 Diverticulosis of large intestine without perforation or abscess without bleeding: Secondary | ICD-10-CM | POA: Diagnosis not present

## 2020-03-12 DIAGNOSIS — E6609 Other obesity due to excess calories: Secondary | ICD-10-CM | POA: Diagnosis not present

## 2020-03-12 DIAGNOSIS — I10 Essential (primary) hypertension: Secondary | ICD-10-CM | POA: Diagnosis not present

## 2020-03-12 DIAGNOSIS — Z6832 Body mass index (BMI) 32.0-32.9, adult: Secondary | ICD-10-CM | POA: Diagnosis not present

## 2020-05-03 DIAGNOSIS — Z20822 Contact with and (suspected) exposure to covid-19: Secondary | ICD-10-CM | POA: Diagnosis not present
